# Patient Record
Sex: Male | Born: 1995 | Marital: Single | Smoking: Current some day smoker
Health system: Southern US, Community
[De-identification: ages and names within clinical notes are randomized; demographics above are authoritative.]

---

## 2018-07-27 ENCOUNTER — Telehealth: Payer: Self-pay

## 2018-07-27 ENCOUNTER — Other Ambulatory Visit: Payer: Self-pay | Admitting: Internal Medicine

## 2018-07-27 ENCOUNTER — Telehealth: Payer: Self-pay | Admitting: *Deleted

## 2018-07-27 ENCOUNTER — Ambulatory Visit
Admission: RE | Admit: 2018-07-27 | Discharge: 2018-07-27 | Disposition: A | Discharge status: Home / Self Care | Payer: BLUE CROSS/BLUE SHIELD | Source: Ambulatory Visit | Attending: Internal Medicine | Admitting: Internal Medicine

## 2018-07-27 ENCOUNTER — Encounter: Payer: Self-pay | Admitting: Internal Medicine

## 2018-07-27 ENCOUNTER — Ambulatory Visit (INDEPENDENT_AMBULATORY_CARE_PROVIDER_SITE_OTHER): Payer: BLUE CROSS/BLUE SHIELD | Admitting: Internal Medicine

## 2018-07-27 VITALS — BP 104/58 | HR 69 | Temp 98.3°F | Resp 16

## 2018-07-27 DIAGNOSIS — R109 Unspecified abdominal pain: Secondary | ICD-10-CM | POA: Diagnosis not present

## 2018-07-27 DIAGNOSIS — K581 Irritable bowel syndrome with constipation: Secondary | ICD-10-CM | POA: Diagnosis not present

## 2018-07-27 DIAGNOSIS — J309 Allergic rhinitis, unspecified: Secondary | ICD-10-CM | POA: Insufficient documentation

## 2018-07-27 DIAGNOSIS — J301 Allergic rhinitis due to pollen: Secondary | ICD-10-CM | POA: Diagnosis not present

## 2018-07-27 DIAGNOSIS — R52 Pain, unspecified: Secondary | ICD-10-CM | POA: Diagnosis not present

## 2018-07-27 DIAGNOSIS — R1013 Epigastric pain: Secondary | ICD-10-CM | POA: Insufficient documentation

## 2018-07-27 DIAGNOSIS — R509 Fever, unspecified: Secondary | ICD-10-CM

## 2018-07-27 DIAGNOSIS — K589 Irritable bowel syndrome without diarrhea: Secondary | ICD-10-CM | POA: Insufficient documentation

## 2018-07-27 LAB — COMPREHENSIVE METABOLIC PANEL
ALT: 20 U/L (ref 0–53)
AST: 11 U/L (ref 0–37)
Albumin: 4.6 g/dL (ref 3.5–5.2)
Alkaline Phosphatase: 56 U/L (ref 39–117)
BUN: 13 mg/dL (ref 6–23)
CO2: 26 mEq/L (ref 19–32)
Calcium: 9.9 mg/dL (ref 8.4–10.5)
Chloride: 98 mEq/L (ref 96–112)
Creatinine, Ser: 1.04 mg/dL (ref 0.40–1.50)
GFR: 89.48 mL/min (ref >60.00)
GLUCOSE: 92 mg/dL (ref 70–99)
POTASSIUM: 4.3 meq/L (ref 3.5–5.1)
Sodium: 133 mEq/L — ABNORMAL LOW (ref 135–145)
TOTAL PROTEIN: 7.8 g/dL (ref 6.0–8.3)
Total Bilirubin: 1 mg/dL (ref 0.2–1.2)

## 2018-07-27 LAB — SEDIMENTATION RATE: Sed Rate: 38 mm/hr — ABNORMAL HIGH (ref 0–15)

## 2018-07-27 LAB — POCT URINALYSIS DIPSTICK
Bilirubin, UA: NEGATIVE
Blood, UA: NEGATIVE
Glucose, UA: NEGATIVE
Leukocytes, UA: NEGATIVE
NITRITE UA: NEGATIVE
Protein, UA: POSITIVE — AB
Spec Grav, UA: 1.02 (ref 1.010–1.025)
Urobilinogen, UA: 0.2 E.U./dL
pH, UA: 6 (ref 5.0–8.0)

## 2018-07-27 LAB — CBC WITH DIFFERENTIAL/PLATELET
Basophils Absolute: 0 10*3/uL (ref 0.0–0.1)
Basophils Relative: 0.5 % (ref 0.0–3.0)
Eosinophils Absolute: 0 10*3/uL (ref 0.0–0.7)
Eosinophils Relative: 0.6 % (ref 0.0–5.0)
HCT: 46.1 % (ref 39.0–52.0)
Hemoglobin: 15.9 g/dL (ref 13.0–17.0)
Lymphocytes Relative: 12.3 % (ref 12.0–46.0)
Lymphs Abs: 1 10*3/uL (ref 0.7–4.0)
MCHC: 34.4 g/dL (ref 30.0–36.0)
MCV: 88.2 fl (ref 78.0–100.0)
MONO ABS: 1 10*3/uL (ref 0.1–1.0)
Monocytes Relative: 12.1 % — ABNORMAL HIGH (ref 3.0–12.0)
NEUTROS PCT: 74.5 % (ref 43.0–77.0)
Neutro Abs: 6.1 10*3/uL (ref 1.4–7.7)
Platelets: 152 10*3/uL (ref 150.0–400.0)
RBC: 5.22 Mil/uL (ref 4.22–5.81)
RDW: 13.5 % (ref 11.5–15.5)
WBC: 8.2 10*3/uL (ref 4.0–10.5)

## 2018-07-27 LAB — URINALYSIS, MICROSCOPIC ONLY: RBC / HPF: NONE SEEN (ref 0–?)

## 2018-07-27 LAB — POCT INFLUENZA A/B
Influenza A, POC: NEGATIVE
Influenza B, POC: NEGATIVE

## 2018-07-27 LAB — LIPASE: LIPASE: 26 U/L (ref 11.0–59.0)

## 2018-07-27 LAB — POCT RAPID STREP A (OFFICE): Rapid Strep A Screen: NEGATIVE

## 2018-07-27 MED ORDER — IOHEXOL 300 MG/ML  SOLN
100.0000 mL | Freq: Once | INTRAMUSCULAR | Status: AC | PRN
  Administered 2018-07-27: 100 mL via INTRAVENOUS

## 2018-07-27 MED ORDER — LEVOFLOXACIN 500 MG PO TABS: 500.0000 mg | ORAL_TABLET | Freq: Every day | ORAL | 0 refills | Status: AC

## 2018-07-27 MED ORDER — HYDROCOD POLST-CPM POLST ER 10-8 MG/5ML PO SUER: 5.0000 mL | Freq: Two times a day (BID) | ORAL | 0 refills | Status: AC | PRN

## 2018-07-27 NOTE — Telephone Encounter (Signed)
Results of CT given to patient and parents. He has pneumonia,   Medication sent to [facility] on [address]

## 2018-07-27 NOTE — Progress Notes (Signed)
Subjective:  Patient ID: Oscar Collins, adult    DOB: [birth year]  Age: 23 y.o. MRN: [id number]  CC: The primary encounter diagnosis was Body aches. Diagnoses of Irritable bowel syndrome with constipation, Seasonal allergic rhinitis due to pollen, Epigastric pain, Fever, unspecified fever cause, Left flank pain, and Fever in adult were also pertinent to this visit.  HPI Oscar Collins presents for urgent evaluation of abdominal pain and new onset daily fevers to 102.  Patient is an Engineer, civil (consulting) and is referred by Coralyn Mark to [place] care  at the urgent request of his father who is out of town.   He is a healthy 23 yr old heterosexual college student who developed fever , body aches and mid epigastric  abdominal pain on Tuesday. Woke up feeling fine on Tuesday , but  after going for a run and taking a shower , he started to feel bad .  Body aches,  Temp of 102. Nausea without vomiting .    No diarrhea, no rash  Some back pain on the left , mild headache but denies neck pain pain. He has been having an Occasional dry cough but denies  dyspnea, wheezing or sinus drainage.   Has been taking Advil alternating with Tylenol every 6 hours for the last 48 hours .Marland Kitchen  Felt better this morning , but by the time he got ready for appointment today started feeling bad again. He was evaluated by the [facility]  clinic yesterday and rapid flu test was negative. Has been "drinking lots of water"  But has had no appetite since Tuesday night.   History of food intolerances  That often cause some abd pain ,  Ate "something  That was off limits" last Thursday and developed mild abd pain which became moderate to severe on Tuesday . 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  Takes a probiotic daily   Sexually active,  Monogamous heterosexual relationship.  Reports previous testing for HIV.    History Oscar Collins has no past medical history on  file.   She has no past surgical history on file.   Her family history includes Bipolar disorder in her mother; Inflammatory bowel disease in her father.She reports that she has been smoking. She has never used smokeless tobacco. She reports current alcohol use of about 10.0 standard drinks of alcohol per week. She reports that she does not use drugs.  No outpatient medications prior to visit.   No facility-administered medications prior to visit.     Review of Systems:  Patient denies  skin rash, eye pain, sinus congestion and sinus pain, sore throat, dysphagia,  hemoptysis , dyspnea, wheezing, chest pain, palpitations, orthopnea, edema,  melena, diarrhea, constipation, flank pain, dysuria, hematuria, urinary  Frequency, nocturia, numbness, tingling, seizures,  Focal weakness, Loss of consciousness,  Tremor, insomnia, depression, anxiety, and suicidal ideation.     Objective:  BP (!) 104/58   Pulse 69   Temp 98.3 F (36.8 C)   Resp 16   SpO2 98%   Physical Exam:  .General appearance: alert, cooperative and appears stated age. Appears moderately ill.  Ears: normal TM's and external ear canals both ears Face: no sinus tenderness. Throat: lips normal,  tongue with thick coating ; teeth and gums normal. Buccal mucosa without spots Neck: no adenopathy, no carotid bruit, supple, symmetrical, trachea midline and thyroid not enlarged, symmetric, no tenderness/mass/nodules. No neck rigidity  Back: symmetric, no curvature. ROM normal. No CVA tenderness. Lungs: clear to auscultation bilaterally.  Not tachypneic  Heart: regular rate and rhythm, S1, S2 normal, no murmur, click, rub or gallop Abdomen: soft, tender to palpation in mid epigastric region ; bowel sounds normal; no masses,  no organomegaly Pulses: 2+ and symmetric Skin: Skin color, texture, turgor normal. No rashes or lesions Lymph nodes: Cervical, supraclavicular, and axillary nodes normal. Neuro:  awake and interactive with  normal mood and affect. Higher cortical functions are normal. Speech is clear without word-finding difficulty or dysarthria. Extraocular movements are intact. Visual fields of both eyes are grossly intact.     Assessment & Plan:   Problem List Items Addressed This Visit    Allergic rhinitis    Managed with Singulair      Fever in adult    DDX broad,  Measles included in DDX given known outbreak at [employer] .  However he had  no Koplik's spots .  Measles IgM/IgG titerpending.  Labs normal except for mildly elevated ESR. Patient was sent for CT abd and pelvis to rule out appendicitis, pancreatitis. Stat report called at 7:30 PM:  LLL patchy infiltrates c/w lobar pneumonia.  No appendicitis , pancreatitis or diverticulitis. Patient and parents notified. Levaquin and Tussionex sent to [place], advised to start Levaquin tonight.  Repeat chest x ray in 4-6 weeks .  Can follow up with his family physician when he returns home in 2 weeks for spring break if doing well,  Advised to return to me next week if no improvement over the weekend.  Advised to remain out of classes until Monday.  Letter to school written        IBS (irritable bowel syndrome)    Other Visit Diagnoses    Body aches    -  Primary   Relevant Orders   POCT rapid strep A (Completed)   POCT Influenza A/B (Completed)   Measles/Mumps/Rubella Immunity   Epigastric pain       Relevant Orders   Lipase (Completed)   Comprehensive metabolic panel (Completed)   CT Abdomen Pelvis W Contrast (Completed)   Fever, unspecified fever cause       Relevant Orders   Measles/Mumps/Rubella Immunity   CBC with Differential/Platelet (Completed)   Sedimentation rate (Completed)   HIV Antibody (routine testing w rflx)   Left flank pain       Relevant Orders   POCT urinalysis dipstick (Completed)   Urine Microscopic Only (Completed)   Urine Culture      Oscar Collins does not currently have medications on file.  No  orders of the defined types were placed in this encounter.   There are no discontinued medications.  Follow-up: No follow-ups on file.   Crecencio Mc, MD

## 2018-07-27 NOTE — Assessment & Plan Note (Signed)
Managed with Singulair

## 2018-07-27 NOTE — Telephone Encounter (Signed)
Copied from CRM [id number]. Topic: General - Other >> Jul 27, 2018 11:49 AM Marylen Ponto wrote: Reason for CRM: Pt returned call to office. Pt stated he received a message from [place] asking him to return her call to provide insurance information. Pt requests call back. Cb# [phone], will take at appointment .

## 2018-07-27 NOTE — Telephone Encounter (Signed)
Spoke with pt's son and he gave [place] a one time verbal to speak with the pt's mother in regards to the pt's office visit today. The mother was wanting to know what Dr. Darrick Huntsman was looking for with the CT scan. Mother was told that she was looking for appendicitis and pancreatitis. Mother gave a verbal understanding.

## 2018-07-27 NOTE — Patient Instructions (Signed)
I have ordered a STAT CT of the abdomen and pelvis  to rule out appendicitis .  Our office will call you with the apppointment after 1:30

## 2018-07-27 NOTE — Assessment & Plan Note (Signed)
DDX broad,  Measles included in DDX given known outbreak at [employer] .  However he had  no Koplik's spots .  Measles IgM/IgG titerpending.  Labs normal except for mildly elevated ESR. Patient was sent for CT abd and pelvis to rule out appendicitis, pancreatitis. Stat report called at 7:30 PM:  LLL patchy infiltrates c/w lobar pneumonia.  No appendicitis , pancreatitis or diverticulitis. Patient and parents notified. Levaquin and Tussionex sent to [place], advised to start Levaquin tonight.  Repeat chest x ray in 4-6 weeks .  Can follow up with his family physician when he returns home in 2 weeks for spring break if doing well,  Advised to return to me next week if no improvement over the weekend.  Advised to remain out of classes until Monday.  Letter to school written

## 2018-07-27 NOTE — Telephone Encounter (Signed)
Copied from CRM [id number]. Topic: General - Inquiry >> Jul 27, 2018  3:52 PM Terisa Starr wrote: Reason for CRM: Patient's mom is calling to see if there could be a rush put on his CT scan and the results because she is in [place] and is wondering if she needs to fly back home. Please advise, [phone], she is not on DPR

## 2018-07-28 LAB — URINE CULTURE
MICRO NUMBER:: 65278
Result:: NO GROWTH
SPECIMEN QUALITY:: ADEQUATE

## 2018-07-29 LAB — MEASLES/MUMPS/RUBELLA IMMUNITY
Mumps IgG: 290 AU/mL
Rubella: 2.71 index
Rubeola IgG: 31.8 AU/mL

## 2018-07-29 LAB — HIV ANTIBODY (ROUTINE TESTING W REFLEX): HIV: NONREACTIVE

## 2018-08-01 ENCOUNTER — Ambulatory Visit: Payer: Self-pay | Admitting: *Deleted

## 2018-08-01 NOTE — Telephone Encounter (Signed)
Message from Gerrianne Scale sent at 08/01/2018 12:40 PM EST   Pt calling stating that he sweats at night with no fever and think its from the medicine that he was put on I tried to get more information he wouldn't say the two medicines are chlorpheniramine-HYDROcodone (TUSSIONEX PENNKINETIC ER) 10-8 MG/5ML SURE And the levofloxacin (LEVAQUIN) 500 MG tablet       Called patient back regarding adverse reactions from taking prescribed medications above. He denies fever. Per reference, the chlorpheniramine-hydrocodone does have an adverse reaction of diaphoresis and not the levofloxacin.  He stated that he is now feeling better and will not take the chlorpheniramine-hydrocodone tonight when he goes to bed.  If he continues to sweat, he will call back. He also wanted to know when he could get back into running a mile.  Advised to take it slowly, gradually build up. Pt voiced understanding. Routing to flow at [facility].  Reason for Disposition . Caller has medication question, adult has minor symptoms, caller declines triage, and triager answers question  Answer Assessment - Initial Assessment Questions 1. SYMPTOMS: "Do you have any symptoms?"     Sweating at night 2. SEVERITY: If symptoms are present, ask "Are they mild, moderate or severe?"     Mild to moderate  Protocols used: MEDICATION QUESTION CALL-A-AH

## 2018-08-01 NOTE — Telephone Encounter (Signed)
FYI

## 2020-04-10 IMAGING — CT CT ABD-PELV W/ CM
2 of 4 series · 16 of 46 positions shown, 18 images · IV contrast (APPLIED)
Comparison: None.

CLINICAL DATA: Fever, nausea and diffuse abdominal pain for the
past 2 days. Alcohol abuse. Clinical concern for pancreatitis or
appendicitis.

EXAM:
CT ABDOMEN AND PELVIS WITH CONTRAST
TECHNIQUE: Multidetector CT imaging of the abdomen and pelvis was performed
using the standard protocol following bolus administration of
intravenous contrast.
CONTRAST:  100mL OMNIPAQUE IOHEXOL 300 MG/ML  SOLN

[Series 2: axial st · axial · 0.68mm/px · z∈[-906,-466]mm · 13 of 96 slices shown, 15 images]
[im 4/96  soft-tissue]
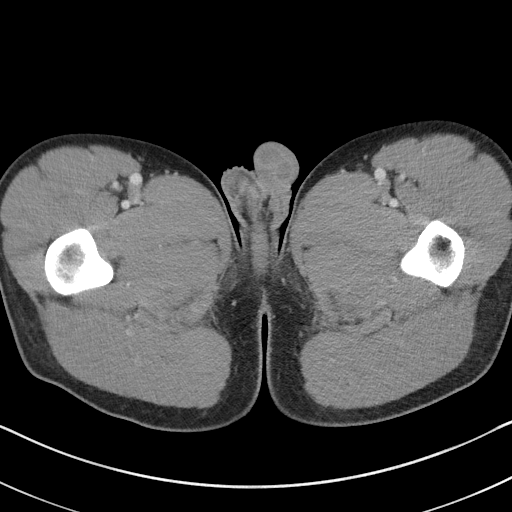
[im 4/96  bone]
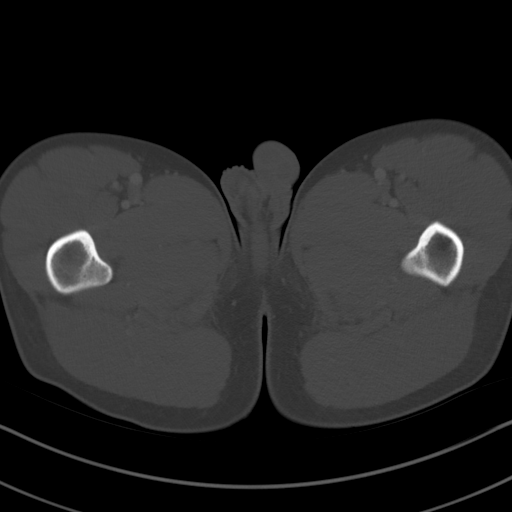
[im 12/96  soft-tissue]
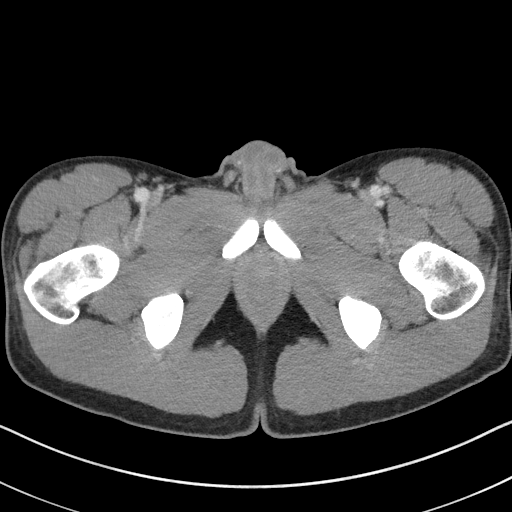
[im 20/96  soft-tissue]
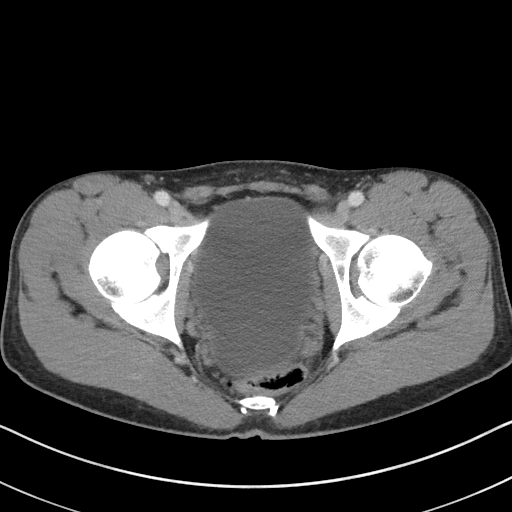
[im 27/96  soft-tissue]
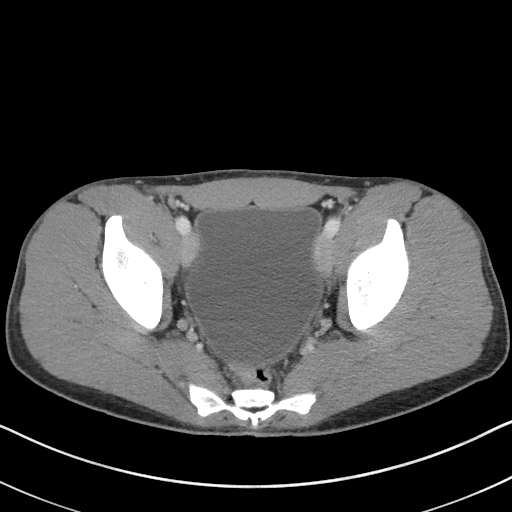
[im 35/96  soft-tissue]
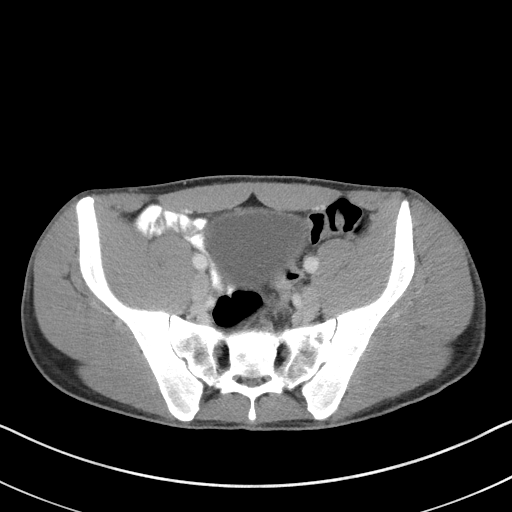
[im 42/96  soft-tissue]
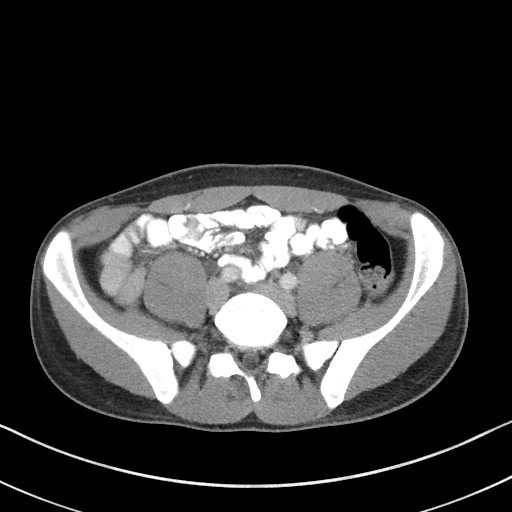
[im 50/96  soft-tissue]
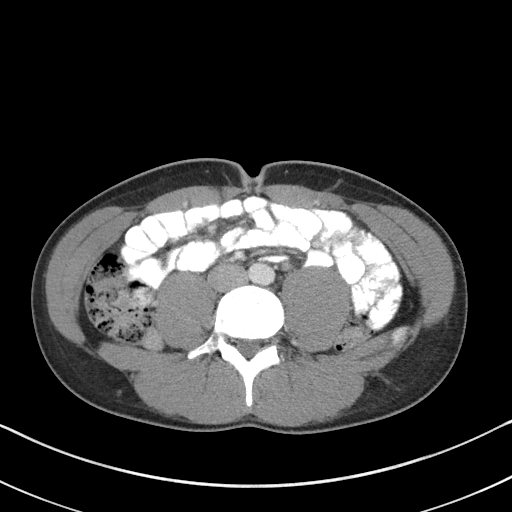
[im 54/96  soft-tissue]
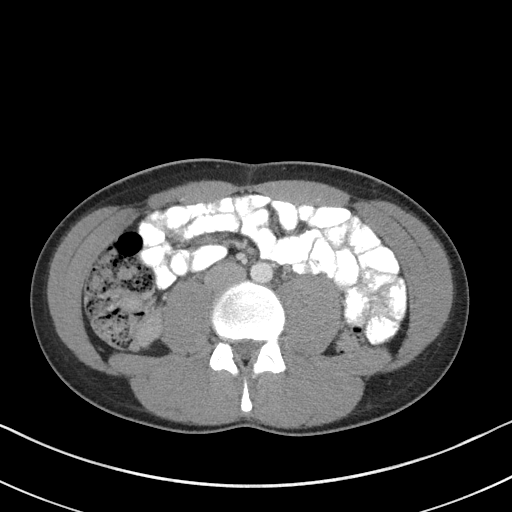
[im 61/96  soft-tissue]
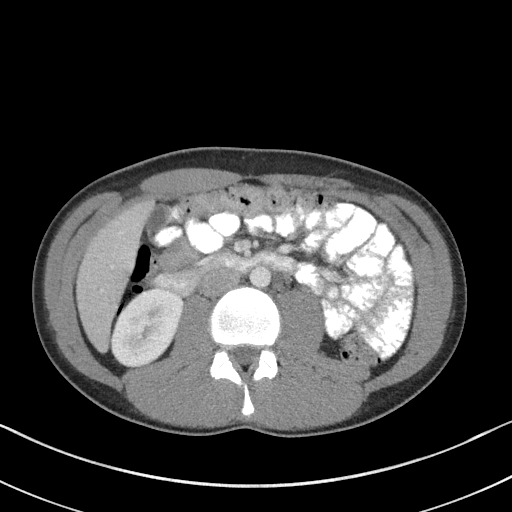
[im 61/96  bone]
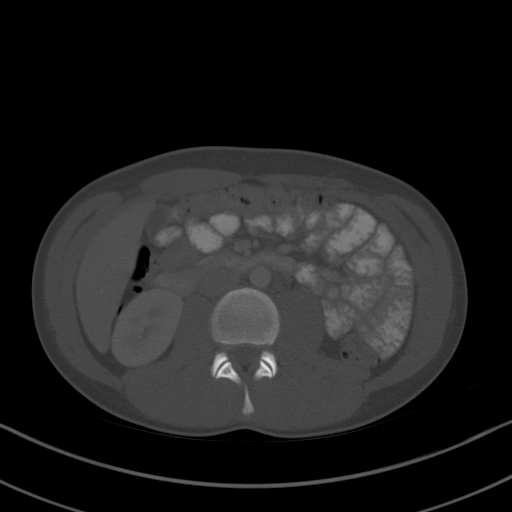
[im 69/96  soft-tissue]
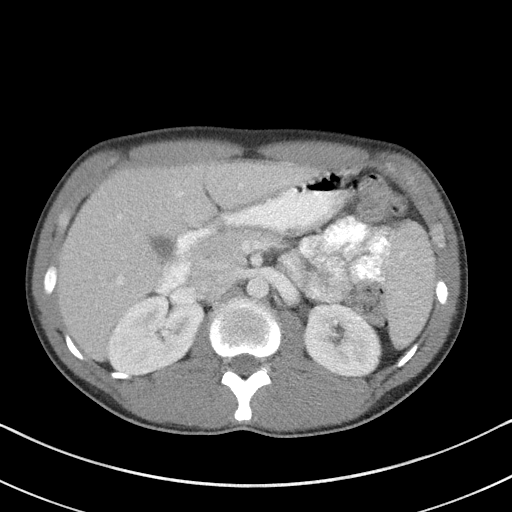
[im 77/96  soft-tissue]
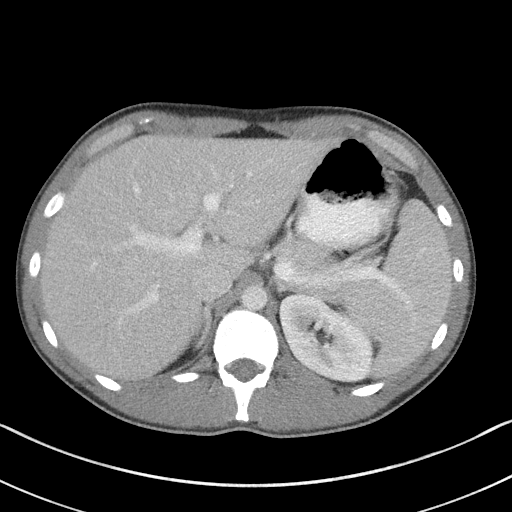
[im 84/96  soft-tissue]
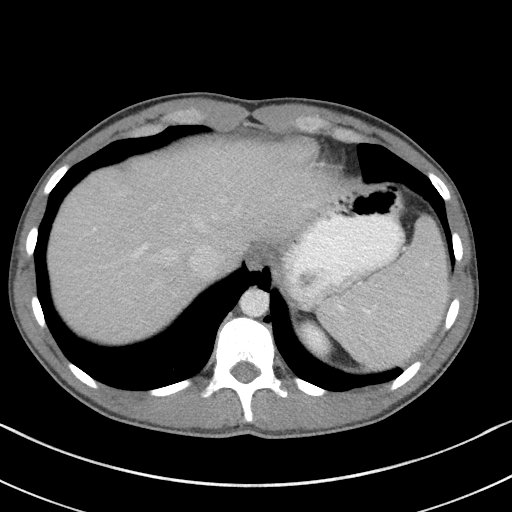
[im 92/96  soft-tissue]
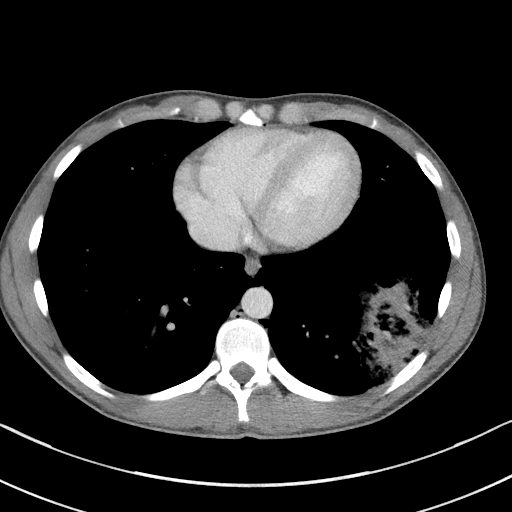

[Series 5: coronal st · coronal · 0.73mm/px · 3 of 78 slices shown]
[im 26/78  soft-tissue]
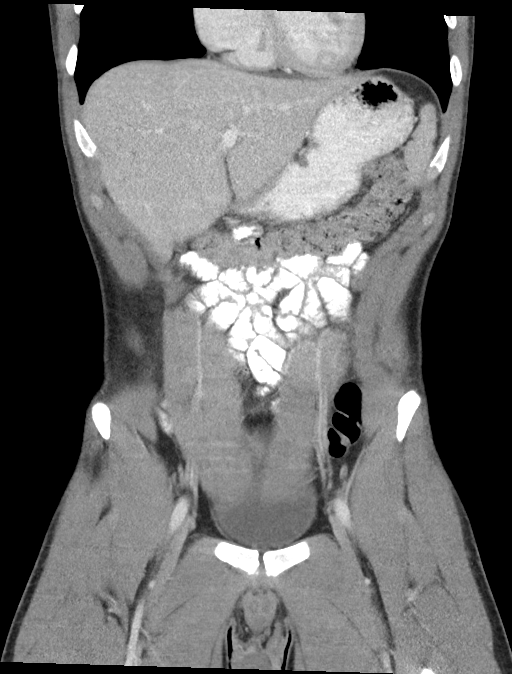
[im 35/78  soft-tissue]
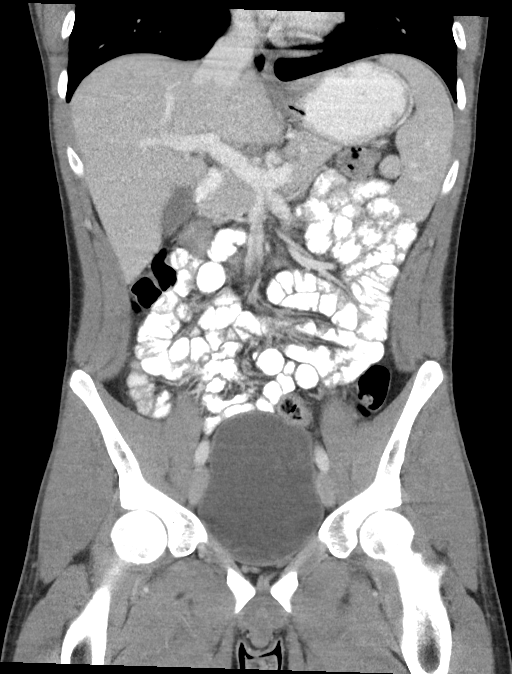
[im 43/78  soft-tissue]
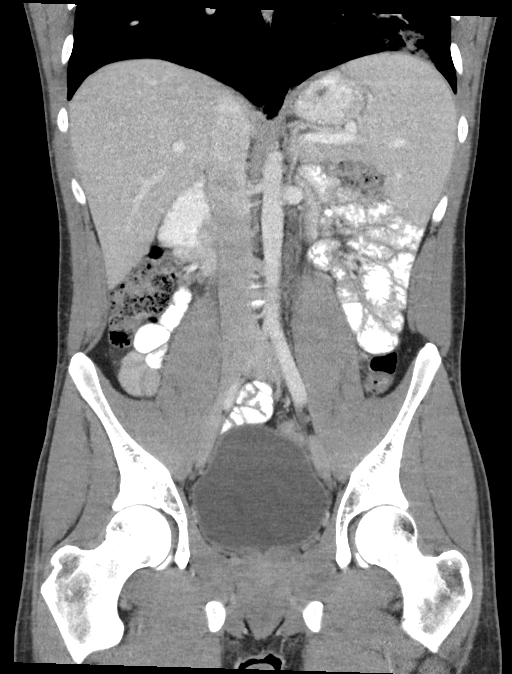

[16 of 46 positions shown; findings below may reference images not displayed]

FINDINGS: Lower chest: Patchy and confluent airspace opacity in the left lower
lobe. Clear right lung base. Normal sized heart.

Hepatobiliary: No focal liver abnormality is seen. No gallstones,
gallbladder wall thickening, or biliary dilatation.

Pancreas: Unremarkable. No pancreatic ductal dilatation or
surrounding inflammatory changes.

Spleen: Normal in size without focal abnormality.

Adrenals/Urinary Tract: Adrenal glands are unremarkable. Kidneys are
normal, without renal calculi, focal lesion, or hydronephrosis.
Bladder is unremarkable.

Stomach/Bowel: Unremarkable stomach, small bowel and colon. No
evidence of appendicitis.

Vascular/Lymphatic: No significant vascular findings are present. No
enlarged abdominal or pelvic lymph nodes.

Reproductive: Prostate is unremarkable.

Other: Small umbilical hernia containing fat.

Musculoskeletal: Normal appearing bones with bilateral proximal
femoral bone islands incidentally noted.
IMPRESSION: 1. Left lower lobe pneumonia.
2. No acute abnormality in the abdomen or pelvis.
